# Patient Record
Sex: Female | Born: 2000
Health system: Southern US, Community
[De-identification: ages and names within clinical notes are randomized; demographics above are authoritative.]

## PROBLEM LIST (undated history)

## (undated) DIAGNOSIS — S83106A Unspecified dislocation of unspecified knee, initial encounter: Secondary | ICD-10-CM

---
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
## 2021-04-15 ENCOUNTER — Emergency Department (HOSPITAL_COMMUNITY)
Admission: EM | Admit: 2021-04-15 | Discharge: 2021-04-15 | Disposition: A | Discharge status: Home / Self Care | Payer: Medicaid Other | Attending: Emergency Medicine | Admitting: Emergency Medicine

## 2021-04-15 ENCOUNTER — Other Ambulatory Visit: Payer: Self-pay

## 2021-04-15 ENCOUNTER — Encounter (HOSPITAL_COMMUNITY): Payer: Self-pay

## 2021-04-15 ENCOUNTER — Emergency Department (HOSPITAL_COMMUNITY): Payer: Medicaid Other

## 2021-04-15 DIAGNOSIS — Y92410 Unspecified street and highway as the place of occurrence of the external cause: Secondary | ICD-10-CM | POA: Insufficient documentation

## 2021-04-15 DIAGNOSIS — R202 Paresthesia of skin: Secondary | ICD-10-CM | POA: Insufficient documentation

## 2021-04-15 DIAGNOSIS — M542 Cervicalgia: Secondary | ICD-10-CM | POA: Diagnosis present

## 2021-04-15 MED ORDER — IBUPROFEN 200 MG PO TABS
600.0000 mg | ORAL_TABLET | Freq: Once | ORAL | Status: AC
  Administered 2021-04-15: 600 mg via ORAL
  Filled 2021-04-15: qty 3
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
## 2021-04-15 NOTE — Discharge Instructions (Addendum)
Return for any problem.  Apply ice or heat as instructed.  Use ibuprofen as instructed - 600 mg every 8 hours - for treatment of pain.  Take ibuprofen with food.

## 2021-04-15 NOTE — ED Notes (Signed)
Pt NAD, a/ox4. Pt verbalizes understanding of all DC and f/u instructions. All questions answered. Pt walks with steady gait to lobby at DC.  ? ?
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
## 2021-12-21 ENCOUNTER — Emergency Department (HOSPITAL_COMMUNITY): Payer: Medicaid Other

## 2021-12-21 ENCOUNTER — Other Ambulatory Visit: Payer: Self-pay

## 2021-12-21 ENCOUNTER — Encounter (HOSPITAL_COMMUNITY): Payer: Self-pay

## 2021-12-21 ENCOUNTER — Emergency Department (HOSPITAL_COMMUNITY)
Admission: EM | Admit: 2021-12-21 | Discharge: 2021-12-21 | Disposition: A | Discharge status: Home / Self Care | Payer: Medicaid Other | Attending: Emergency Medicine | Admitting: Emergency Medicine

## 2021-12-21 DIAGNOSIS — R590 Localized enlarged lymph nodes: Secondary | ICD-10-CM | POA: Diagnosis not present

## 2021-12-21 DIAGNOSIS — R509 Fever, unspecified: Secondary | ICD-10-CM | POA: Diagnosis present

## 2021-12-21 DIAGNOSIS — Z20822 Contact with and (suspected) exposure to covid-19: Secondary | ICD-10-CM | POA: Diagnosis not present

## 2021-12-21 DIAGNOSIS — J029 Acute pharyngitis, unspecified: Secondary | ICD-10-CM

## 2021-12-21 DIAGNOSIS — R1110 Vomiting, unspecified: Secondary | ICD-10-CM | POA: Diagnosis not present

## 2021-12-21 LAB — CBC WITH DIFFERENTIAL/PLATELET
Abs Immature Granulocytes: 0.16 10*3/uL — ABNORMAL HIGH (ref 0.00–0.07)
Basophils Absolute: 0.1 10*3/uL (ref 0.0–0.1)
Basophils Relative: 0 %
Eosinophils Absolute: 0 10*3/uL (ref 0.0–0.5)
Eosinophils Relative: 0 %
HCT: 37.9 % (ref 36.0–46.0)
Hemoglobin: 12.1 g/dL (ref 12.0–15.0)
Immature Granulocytes: 1 %
Lymphocytes Relative: 3 %
Lymphs Abs: 0.7 10*3/uL (ref 0.7–4.0)
MCH: 29.2 pg (ref 26.0–34.0)
MCHC: 31.9 g/dL (ref 30.0–36.0)
MCV: 91.3 fL (ref 80.0–100.0)
Monocytes Absolute: 1.1 10*3/uL — ABNORMAL HIGH (ref 0.1–1.0)
Monocytes Relative: 5 %
Neutro Abs: 20 10*3/uL — ABNORMAL HIGH (ref 1.7–7.7)
Neutrophils Relative %: 91 %
Platelets: UNDETERMINED 10*3/uL (ref 150–400)
RBC: 4.15 MIL/uL (ref 3.87–5.11)
RDW: 14.6 % (ref 11.5–15.5)
WBC: 22 10*3/uL — ABNORMAL HIGH (ref 4.0–10.5)
nRBC: 0 % (ref 0.0–0.2)

## 2021-12-21 LAB — BASIC METABOLIC PANEL
Anion gap: 7 (ref 5–15)
BUN: 11 mg/dL (ref 6–20)
CO2: 23 mmol/L (ref 22–32)
Calcium: 9.1 mg/dL (ref 8.9–10.3)
Chloride: 109 mmol/L (ref 98–111)
Creatinine, Ser: 0.97 mg/dL (ref 0.44–1.00)
GFR, Estimated: >60 mL/min (ref >60)
Glucose, Bld: 119 mg/dL — ABNORMAL HIGH (ref 70–99)
Potassium: 4.1 mmol/L (ref 3.5–5.1)
Sodium: 139 mmol/L (ref 135–145)

## 2021-12-21 LAB — MONONUCLEOSIS SCREEN: Mono Screen: NEGATIVE

## 2021-12-21 LAB — RESP PANEL BY RT-PCR (FLU A&B, COVID) ARPGX2
Influenza A by PCR: NEGATIVE
Influenza B by PCR: NEGATIVE
SARS Coronavirus 2 by RT PCR: NEGATIVE

## 2021-12-21 LAB — GROUP A STREP BY PCR: Group A Strep by PCR: NOT DETECTED

## 2021-12-21 MED ORDER — ACETAMINOPHEN 325 MG PO TABS
650.0000 mg | ORAL_TABLET | Freq: Once | ORAL | Status: AC
  Administered 2021-12-21: 650 mg via ORAL
  Filled 2021-12-21: qty 2

## 2021-12-21 MED ORDER — CEPHALEXIN 500 MG PO CAPS: 500.0000 mg | ORAL_CAPSULE | Freq: Four times a day (QID) | ORAL | 0 refills | Status: AC

## 2021-12-21 MED ORDER — ONDANSETRON 4 MG PO TBDP
4.0000 mg | ORAL_TABLET | Freq: Once | ORAL | Status: AC
  Administered 2021-12-21: 4 mg via ORAL
  Filled 2021-12-21: qty 1

## 2021-12-21 NOTE — ED Triage Notes (Addendum)
Patient c/o emesis, swollen lymph nodes of the neck, headache, and fever since last night.  Patient states she had Tylenol 1 tab at 0900 today.

## 2021-12-21 NOTE — ED Notes (Signed)
Temp measurement of 103.3. RN advised. [employer]

## 2021-12-21 NOTE — ED Provider Notes (Signed)
[facility]-EMERGENCY DEPT Provider Note   CSN: [id number] Arrival date & time: 12/21/21  1358     History {Add pertinent medical, surgical, social history, OB history to HPI:1} Chief Complaint  Patient presents with   Emesis   Lymphadenopathy   Fever   Headache    Jean Lewis is a 21 y.o. female.  Patient complains of sore throat fever.   Emesis Associated symptoms: fever and headaches   Fever Associated symptoms: headaches and vomiting   Headache Associated symptoms: fever and vomiting        Home Medications Prior to Admission medications   Medication Sig Start Date End Date Taking? Authorizing Provider  cephALEXin (KEFLEX) 500 MG capsule Take 1 capsule (500 mg total) by mouth 4 (four) times daily. 12/21/21  Yes Bethann Berkshire, MD  hydrOXYzine (ATARAX/VISTARIL) 25 MG tablet Take 1 tablet (25 mg total) by mouth every 6 (six) hours. 07/01/19   Bing Neighbors, FNP  ibuprofen (ADVIL) 600 MG tablet Take 1 tablet (600 mg total) by mouth 3 (three) times daily. Take with food 08/05/19   Triplett, Tammy, PA-C  Skin Protectants, Misc. (EUCERIN) cream Apply topically as needed for dry skin. 08/02/19   Dietrich Pates, PA-C      Allergies    Patient has no known allergies.    Review of Systems   Review of Systems  Constitutional:  Positive for fever.  Gastrointestinal:  Positive for vomiting.  Neurological:  Positive for headaches.    Physical Exam Updated Vital Signs BP 115/78   Pulse (!) 122   Temp (!) 103.3 F (39.6 C) (Oral)   Resp (!) 23   Ht 5\' 6"  (1.676 m)   Wt 113.4 kg   LMP 11/30/2021 (Approximate)   SpO2 100%   BMI 40.35 kg/m  Physical Exam  ED Results / Procedures / Treatments   Labs (all labs ordered are listed, but only abnormal results are displayed) Labs Reviewed  BASIC METABOLIC PANEL - Abnormal; Notable for the following components:      Result Value   Glucose, Bld 119 (*)    All other components within normal  limits  CBC WITH DIFFERENTIAL/PLATELET - Abnormal; Notable for the following components:   WBC 22.0 (*)    Neutro Abs 20.0 (*)    Monocytes Absolute 1.1 (*)    Abs Immature Granulocytes 0.16 (*)    All other components within normal limits  RESP PANEL BY RT-PCR (FLU A&B, COVID) ARPGX2  GROUP A STREP BY PCR  MONONUCLEOSIS SCREEN    EKG None  Radiology DG Chest Port 1 View  Result Date: 12/21/2021 CLINICAL DATA:  Short of breath, emesis, lymphadenopathy, fever EXAM: PORTABLE CHEST 1 VIEW COMPARISON:  12/04/2010 FINDINGS: The heart size and mediastinal contours are within normal limits. Both lungs are clear. The visualized skeletal structures are unremarkable. IMPRESSION: No active disease. Electronically Signed   By: 12/06/2010 M.D.   On: 12/21/2021 17:47    Procedures Procedures  {Document cardiac monitor, telemetry assessment procedure when appropriate:1}  Medications Ordered in ED Medications  acetaminophen (TYLENOL) tablet 650 mg (650 mg Oral Given 12/21/21 1757)  ondansetron (ZOFRAN-ODT) disintegrating tablet 4 mg (4 mg Oral Given 12/21/21 1757)    ED Course/ Medical Decision Making/ A&P                           Medical Decision Making Amount and/or Complexity of Data Reviewed Labs: ordered. Radiology: ordered.  Risk OTC drugs. Prescription drug management.   Patient with pharyngitis.  She will be placed on Keflex and will follow-up next week  {Document critical care time when appropriate:1} {Document review of labs and clinical decision tools ie heart score, Chads2Vasc2 etc:1}  {Document your independent review of radiology images, and any outside records:1} {Document your discussion with family members, caretakers, and with consultants:1} {Document social determinants of health affecting pt's care:1} {Document your decision making why or why not admission, treatments were needed:1} Final Clinical Impression(s) / ED Diagnoses Final diagnoses:  Pharyngitis,  unspecified etiology    Rx / DC Orders ED Discharge Orders          Ordered    cephALEXin (KEFLEX) 500 MG capsule  4 times daily        12/21/21 1936

## 2021-12-21 NOTE — ED Provider Triage Note (Signed)
Emergency Medicine Provider Triage Evaluation Note  Jean Lewis , a 21 y.o. female  was evaluated in triage.  Pt complains of fevers, headache, nausea, vomiting, sore throat.  Symptoms started last night.  Symptoms are similar to when she had COVID in the past.  Denies chest pain, shortness of breath, vision or hearing changes  Review of Systems  Positive: As above Negative: As above  Physical Exam  BP 131/88 (BP Location: Left Arm)   Pulse (!) 129   Temp (!) 102.7 F (39.3 C) (Oral)   Resp 18   Ht 5\' 6"  (1.676 m)   Wt 113.4 kg   LMP 11/30/2021 (Approximate)   SpO2 100%   BMI 40.35 kg/m  Gen:   Awake, no distress   Resp:  Normal effort  MSK:   Moves extremities without difficulty  Other:    Medical Decision Making  Medically screening exam initiated at 2:49 PM.  Appropriate orders placed.  Kameko Silvey was informed that the remainder of the evaluation will be completed by another provider, this initial triage assessment does not replace that evaluation, and the importance of remaining in the ED until their evaluation is complete.  We will get basic labs, respiratory panel, strep   Roylene Reason, PA-C 12/21/21 1450
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
## 2023-06-13 ENCOUNTER — Encounter (HOSPITAL_COMMUNITY): Payer: Self-pay | Admitting: Emergency Medicine

## 2023-06-13 ENCOUNTER — Ambulatory Visit (HOSPITAL_COMMUNITY): Admission: EM | Admit: 2023-06-13 | Discharge: 2023-06-13 | Disposition: A | Discharge status: Home / Self Care

## 2023-06-13 DIAGNOSIS — B349 Viral infection, unspecified: Secondary | ICD-10-CM | POA: Diagnosis present

## 2023-06-13 LAB — POCT RAPID STREP A (OFFICE): Rapid Strep A Screen: NEGATIVE

## 2023-06-13 MED ORDER — AZELASTINE HCL 0.1 % NA SOLN: 1.0000 | Freq: Two times a day (BID) | NASAL | 1 refills | Status: AC

## 2023-06-13 MED ORDER — IBUPROFEN 600 MG PO TABS: 600.0000 mg | ORAL_TABLET | Freq: Three times a day (TID) | ORAL | 0 refills | Status: AC

## 2023-06-13 MED ORDER — BENZONATATE 200 MG PO CAPS: 200.0000 mg | ORAL_CAPSULE | Freq: Three times a day (TID) | ORAL | 0 refills | Status: AC | PRN

## 2023-06-13 NOTE — Discharge Instructions (Addendum)
 1. Acute viral syndrome (Primary) - POC rapid strep A completed in UC is negative for strep pharyngitis - Culture, group A strep (throat) sent to lab for further testing results should be available in 2 to 3 days if any abnormality you will be contacted appropriate guidance provided. - azelastine (ASTELIN) 0.1 % nasal spray; Place 1 spray into both nostrils 2 (two) times daily. Use in each nostril as directed  Dispense: 30 mL; Refill: 1 - benzonatate (TESSALON) 200 MG capsule; Take 1 capsule (200 mg total) by mouth 3 (three) times daily as needed for cough.  Dispense: 20 capsule; Refill: 0 - ibuprofen (ADVIL) 600 MG tablet; Take 1 tablet (600 mg total) by mouth 3 (three) times daily. Take with food  Dispense: 30 tablet; Refill: 0 -Continue to monitor symptoms for any change in severity if there is any escalation of current symptoms or development of new symptoms follow-up in ER for further evaluation and management.

## 2023-06-13 NOTE — ED Provider Notes (Signed)
 [facility]  Note:  This document was prepared using Dragon voice recognition software and may include unintentional dictation errors.  MRN: [id number] DOB: [birth year]  Subjective:   Jean Lewis is a 23 y.o. female presenting for nasal congestion, itchy ears, nocturnal cough, sore throat x 1 week.  Patient reports taking NyQuil, ibuprofen, TheraFlu with no improvement.  Patient denies any known sick contacts.  No chest pain, shortness of breath, weakness, dizziness.  No current facility-administered medications for this encounter.  Current Outpatient Medications:    azelastine (ASTELIN) 0.1 % nasal spray, Place 1 spray into both nostrils 2 (two) times daily. Use in each nostril as directed, Disp: 30 mL, Rfl: 1   benzonatate (TESSALON) 200 MG capsule, Take 1 capsule (200 mg total) by mouth 3 (three) times daily as needed for cough., Disp: 20 capsule, Rfl: 0   cephALEXin (KEFLEX) 500 MG capsule, Take 1 capsule (500 mg total) by mouth 4 (four) times daily., Disp: 28 capsule, Rfl: 0   hydrOXYzine (ATARAX/VISTARIL) 25 MG tablet, Take 1 tablet (25 mg total) by mouth every 6 (six) hours., Disp: 12 tablet, Rfl: 0   ibuprofen (ADVIL) 600 MG tablet, Take 1 tablet (600 mg total) by mouth 3 (three) times daily. Take with food, Disp: 30 tablet, Rfl: 0   Skin Protectants, Misc. (EUCERIN) cream, Apply topically as needed for dry skin., Disp: 454 g, Rfl: 0   No Known Allergies  Past Medical History:  Diagnosis Date   Knee dislocation      History reviewed. No pertinent surgical history.  No family history on file.  Social History   Tobacco Use   Smoking status: Never   Smokeless tobacco: Never  Vaping Use   Vaping status: Never Used  Substance Use Topics   Alcohol use: No   Drug use: Yes    Types: Marijuana    ROS Refer to HPI for ROS details.  Objective:   Vitals: BP 110/77 (BP Location: Left Arm)   Pulse 79   Temp 99.7 F (37.6 C) (Oral)   Resp 15   LMP  05/23/2023   SpO2 98%   Physical Exam Vitals and nursing note reviewed.  Constitutional:      General: She is not in acute distress.    Appearance: Normal appearance. She is well-developed. She is not ill-appearing or toxic-appearing.  HENT:     Head: Normocephalic and atraumatic.     Nose: Congestion present.     Mouth/Throat:     Mouth: Mucous membranes are moist.     Pharynx: Oropharyngeal exudate and posterior oropharyngeal erythema present.  Eyes:     Extraocular Movements: Extraocular movements intact.     Conjunctiva/sclera: Conjunctivae normal.  Cardiovascular:     Rate and Rhythm: Normal rate.  Pulmonary:     Effort: Pulmonary effort is normal. No respiratory distress.  Musculoskeletal:     Cervical back: Neck supple. No tenderness.  Lymphadenopathy:     Cervical: No cervical adenopathy.  Skin:    General: Skin is warm and dry.  Neurological:     General: No focal deficit present.     Mental Status: She is alert and oriented to person, place, and time.  Psychiatric:        Mood and Affect: Mood normal.        Behavior: Behavior normal.     Procedures  Results for orders placed or performed during the hospital encounter of 06/13/23 (from the past 24 hours)  POC rapid strep A  Status: None   Collection Time: 06/13/23  1:55 PM  Result Value Ref Range   Rapid Strep A Screen Negative Negative    Assessment and Plan :   PDMP not reviewed this encounter.  1. Acute viral syndrome    1. Acute viral syndrome (Primary) - POC rapid strep A completed in UC is negative for strep pharyngitis - Culture, group A strep (throat) sent to lab for further testing results should be available in 2 to 3 days if any abnormality you will be contacted appropriate guidance provided. - azelastine (ASTELIN) 0.1 % nasal spray; Place 1 spray into both nostrils 2 (two) times daily. Use in each nostril as directed  Dispense: 30 mL; Refill: 1 - benzonatate (TESSALON) 200 MG capsule;  Take 1 capsule (200 mg total) by mouth 3 (three) times daily as needed for cough.  Dispense: 20 capsule; Refill: 0 - ibuprofen (ADVIL) 600 MG tablet; Take 1 tablet (600 mg total) by mouth 3 (three) times daily. Take with food  Dispense: 30 tablet; Refill: 0 -Continue to monitor symptoms for any change in severity if there is any escalation of current symptoms or development of new symptoms follow-up in ER for further evaluation and management.  Lucky Cowboy   Prentice, [facility] [place] [facility], [place] 06/13/23 1407

## 2023-06-13 NOTE — ED Triage Notes (Signed)
 Pt reports that for a week had congestion, scratchy throat and ears. Took Nyquil, Ibuprofen, and Theraflu.

## 2023-06-16 LAB — CULTURE, GROUP A STREP (THRC)
